# Patient Record
Sex: Male | Born: 1990 | Race: Black or African American | Hispanic: No | Marital: Single | Smoking: Former smoker
Health system: Southern US, Community
[De-identification: ages and names within clinical notes are randomized; demographics above are authoritative.]

---

## 2017-05-26 ENCOUNTER — Encounter (HOSPITAL_BASED_OUTPATIENT_CLINIC_OR_DEPARTMENT_OTHER): Payer: Self-pay

## 2017-05-26 ENCOUNTER — Emergency Department (HOSPITAL_BASED_OUTPATIENT_CLINIC_OR_DEPARTMENT_OTHER)
Admission: EM | Admit: 2017-05-26 | Discharge: 2017-05-26 | Disposition: A | Discharge status: Home / Self Care | Payer: BLUE CROSS/BLUE SHIELD | Attending: Emergency Medicine | Admitting: Emergency Medicine

## 2017-05-26 ENCOUNTER — Other Ambulatory Visit: Payer: Self-pay

## 2017-05-26 ENCOUNTER — Emergency Department (HOSPITAL_BASED_OUTPATIENT_CLINIC_OR_DEPARTMENT_OTHER): Payer: BLUE CROSS/BLUE SHIELD

## 2017-05-26 DIAGNOSIS — R05 Cough: Secondary | ICD-10-CM | POA: Diagnosis not present

## 2017-05-26 DIAGNOSIS — F17200 Nicotine dependence, unspecified, uncomplicated: Secondary | ICD-10-CM | POA: Insufficient documentation

## 2017-05-26 DIAGNOSIS — R059 Cough, unspecified: Secondary | ICD-10-CM

## 2017-05-26 MED ORDER — ALBUTEROL SULFATE HFA 108 (90 BASE) MCG/ACT IN AERS
2.0000 | INHALATION_SPRAY | Freq: Once | RESPIRATORY_TRACT | Status: AC
  Administered 2017-05-26: 2 via RESPIRATORY_TRACT
  Filled 2017-05-26: qty 6.7

## 2017-05-26 MED ORDER — OMEPRAZOLE-SODIUM BICARBONATE 40-1100 MG PO CAPS: 1.0000 | ORAL_CAPSULE | Freq: Every day | ORAL | 0 refills | Status: DC

## 2017-05-26 MED ORDER — LEVOCETIRIZINE DIHYDROCHLORIDE 5 MG PO TABS: 5.0000 mg | ORAL_TABLET | Freq: Every day | ORAL | 0 refills | Status: DC

## 2017-05-26 MED ORDER — AEROCHAMBER PLUS W/MASK MISC
1.0000 | Freq: Once | Status: AC
  Administered 2017-05-26: 1
  Filled 2017-05-26: qty 1

## 2017-05-26 NOTE — ED Triage Notes (Signed)
C/o prod cough "since the summer"-NAD-steady gait

## 2017-05-26 NOTE — ED Provider Notes (Signed)
[facility] EMERGENCY DEPARTMENT Provider Note   CSN: [id number] Arrival date & time: 05/26/17  1218     History   Chief Complaint Chief Complaint  Patient presents with  . Cough    HPI Lawrence Kelley is a 26 y.o. male  Cough: Patient complains of productive cough..  Symptoms began 6 months ago.  The cough is productive of clear sputum, waxing and waning over time and is aggravated by cold air Associated symptoms include occasional:wheezing. Patient does not have new pets. Patient does not have a history of asthma. Patient does not have a history of environmental allergens. Patient does not haverecent travel. Patient does have a history of smoking. Patient  does not have previous Chest X-ray. Patient acknowledges daily acid reflux sxs.    HPI  History reviewed. No pertinent past medical history.  There are no active problems to display for this patient.   History reviewed. No pertinent surgical history.     Home Medications    Prior to Admission medications   Medication Sig Start Date End Date Taking? Authorizing Provider  levocetirizine (XYZAL) 5 MG tablet Take 1 tablet (5 mg total) by mouth at bedtime. 05/26/17   Hernando Reali, Cammy CopaAbigail, PA-C  omeprazole-sodium bicarbonate (ZEGERID) 40-1100 MG capsule Take 1 capsule by mouth at bedtime. 05/26/17   Arthor CaptainHarris, Takila Kronberg, PA-C    Family History No family history on file.  Social History Social History   Tobacco Use  . Smoking status: Current Every Day Smoker  . Smokeless tobacco: Never Used  Substance Use Topics  . Alcohol use: Yes    Frequency: Never    Comment: occ  . Drug use: No     Allergies   Patient has no known allergies.   Review of Systems Review of Systems  Ten systems reviewed and are negative for acute change, except as noted in the HPI.   Physical Exam Updated Vital Signs BP 122/82 (BP Location: Left Arm)   Pulse 80   Temp 98.3 F (36.8 C) (Oral)   Resp 14   Ht 5\' 6"  (1.676 m)    Wt 83 kg (183 lb)   SpO2 96%   BMI 29.54 kg/m   Physical Exam  Constitutional: He appears well-developed and well-nourished. No distress.  HENT:  Head: Normocephalic and atraumatic.  Eyes: Conjunctivae are normal. No scleral icterus.  Neck: Normal range of motion. Neck supple.  Cardiovascular: Normal rate, regular rhythm and normal heart sounds.  Pulmonary/Chest: Effort normal and breath sounds normal. No respiratory distress.  Minimal expiratory wheeze  Abdominal: Soft. There is no tenderness.  Musculoskeletal: He exhibits no edema.  Neurological: He is alert.  Skin: Skin is warm and dry. He is not diaphoretic.  Psychiatric: His behavior is normal.  Nursing note and vitals reviewed.    ED Treatments / Results  Labs (all labs ordered are listed, but only abnormal results are displayed) Labs Reviewed - No data to display  EKG  EKG Interpretation None       Radiology Dg Chest 2 View  Result Date: 05/26/2017 CLINICAL DATA:  Cough since summer EXAM: CHEST  2 VIEW COMPARISON:  None. FINDINGS: Heart size and mediastinal contours are within normal limits. Study is slightly hypoinspiratory with crowding of the perihilar and bibasilar bronchovascular markings. Given the low lung volumes, lungs are clear. No pleural effusion or pneumothorax seen. No acute or suspicious osseous finding. IMPRESSION: No active cardiopulmonary disease. No evidence of pneumonia or pulmonary edema. Electronically Signed   By: Weyman CroonStan  Linde GillisMaynard M.D.   On: 05/26/2017 14:15    Procedures Procedures (including critical care time)  Medications Ordered in ED Medications  albuterol (PROVENTIL HFA;VENTOLIN HFA) 108 (90 Base) MCG/ACT inhaler 2 puff (not administered)  aerochamber plus with mask device 1 each (not administered)     Initial Impression / Assessment and Plan / ED Course  I have reviewed the triage vital signs and the nursing notes.  Pertinent labs & imaging results that were available during my  care of the patient were reviewed by me and considered in my medical decision making (see chart for details).     .patient with cough. Likely multifactorial.  The patient was counseled on the dangers of tobacco use, and was advised to quit.  Reviewed strategies to maximize success, including removing cigarettes and smoking materials from environment, stress management, substitution of other forms of reinforcement, support of family/friends and written materials. Will treat reflux sxs and add antihistamine. Patient will be discharged with albuterol. F/u with pcp   Final Clinical Impressions(s) / ED Diagnoses   Final diagnoses:  Cough  Smoking    ED Discharge Orders        Ordered    omeprazole-sodium bicarbonate (ZEGERID) 40-1100 MG capsule  Daily at bedtime     05/26/17 1454    levocetirizine (XYZAL) 5 MG tablet  Daily at bedtime     05/26/17 1454       Arthor CaptainHarris, Umaima Scholten, PA-C 05/26/17 1513    Raeford RazorKohut, Stephen, MD 05/27/17 [phone]

## 2017-05-26 NOTE — Discharge Instructions (Signed)
The Zegerid can be purchased as a generic at [facility]. You may use the inhaler 1-2 puffs every 2-4 hours as needed.   Contact a health care provider if: You have new symptoms. You cough up pus. Your cough does not get better after 2-3 weeks, or your cough gets worse. You cannot control your cough with suppressant medicines and you are losing sleep. You develop pain that is getting worse or pain that is not controlled with pain medicines. You have a fever. You have unexplained weight loss. You have night sweats. Get help right away if: You cough up blood. You have difficulty breathing. Your heartbeat is very fast.

## 2018-03-12 ENCOUNTER — Emergency Department (HOSPITAL_BASED_OUTPATIENT_CLINIC_OR_DEPARTMENT_OTHER): Payer: BLUE CROSS/BLUE SHIELD

## 2018-03-12 ENCOUNTER — Other Ambulatory Visit: Payer: Self-pay

## 2018-03-12 ENCOUNTER — Encounter (HOSPITAL_BASED_OUTPATIENT_CLINIC_OR_DEPARTMENT_OTHER): Payer: Self-pay

## 2018-03-12 ENCOUNTER — Emergency Department (HOSPITAL_BASED_OUTPATIENT_CLINIC_OR_DEPARTMENT_OTHER)
Admission: EM | Admit: 2018-03-12 | Discharge: 2018-03-13 | Disposition: A | Discharge status: Home / Self Care | Payer: BLUE CROSS/BLUE SHIELD | Attending: Emergency Medicine | Admitting: Emergency Medicine

## 2018-03-12 DIAGNOSIS — Z87891 Personal history of nicotine dependence: Secondary | ICD-10-CM | POA: Insufficient documentation

## 2018-03-12 DIAGNOSIS — Y9389 Activity, other specified: Secondary | ICD-10-CM | POA: Diagnosis not present

## 2018-03-12 DIAGNOSIS — Y92511 Restaurant or cafe as the place of occurrence of the external cause: Secondary | ICD-10-CM | POA: Insufficient documentation

## 2018-03-12 DIAGNOSIS — W230XXA Caught, crushed, jammed, or pinched between moving objects, initial encounter: Secondary | ICD-10-CM | POA: Insufficient documentation

## 2018-03-12 DIAGNOSIS — S61221A Laceration with foreign body of left index finger without damage to nail, initial encounter: Secondary | ICD-10-CM | POA: Insufficient documentation

## 2018-03-12 DIAGNOSIS — Y999 Unspecified external cause status: Secondary | ICD-10-CM | POA: Insufficient documentation

## 2018-03-12 DIAGNOSIS — S61211A Laceration without foreign body of left index finger without damage to nail, initial encounter: Secondary | ICD-10-CM

## 2018-03-12 NOTE — ED Triage Notes (Signed)
Pt caught his L index finger in a door. Pt has lac on posterior surface. Bandage applied in triage.

## 2018-03-13 MED ORDER — CEPHALEXIN 500 MG PO CAPS: 500.0000 mg | ORAL_CAPSULE | Freq: Three times a day (TID) | ORAL | 0 refills | Status: AC

## 2018-03-13 MED ORDER — BUPIVACAINE HCL 0.5 % IJ SOLN
5.0000 mL | Freq: Once | INTRAMUSCULAR | Status: AC
  Administered 2018-03-13: 5 mL
  Filled 2018-03-13: qty 1

## 2018-03-13 NOTE — ED Provider Notes (Signed)
[facility] EMERGENCY DEPARTMENT Provider Note   CSN: [id number] Arrival date & time: 03/12/18  2022     History   Chief Complaint Chief Complaint  Patient presents with  . Finger Injury    HPI Lawrence Kelley is a 27 y.o. male.  HPI 27 year old male here with left index finger injury.  The patient states that he was trying to open a door at a restaurant, when his index finger got stuck in the back end of the door.  He states that he immediately removed his finger.  He sustained a laceration.  He has associated aching, throbbing, but also sharp pain in his distal index finger.  He is right-handed.  He denies any history of easy bruising or bleeding.  He believes his tetanus is up-to-date.  No history of poor wound healing.  Pain is worse with palpation.  History reviewed. No pertinent past medical history.  There are no active problems to display for this patient.   History reviewed. No pertinent surgical history.      Home Medications    Prior to Admission medications   Medication Sig Start Date End Date Taking? Authorizing Provider  cephALEXin (KEFLEX) 500 MG capsule Take 1 capsule (500 mg total) by mouth 3 (three) times daily for 5 days. 03/13/18 03/18/18  Shaune Pollack, MD  levocetirizine (XYZAL) 5 MG tablet Take 1 tablet (5 mg total) by mouth at bedtime. 05/26/17   Harris, Cammy Copa, PA-C  omeprazole-sodium bicarbonate (ZEGERID) 40-1100 MG capsule Take 1 capsule by mouth at bedtime. 05/26/17   Arthor Captain, PA-C    Family History No family history on file.  Social History Social History   Tobacco Use  . Smoking status: Former Smoker    Last attempt to quit: 08/10/2017    Years since quitting: 0.5  . Smokeless tobacco: Never Used  Substance Use Topics  . Alcohol use: Yes    Frequency: Never    Comment: occ  . Drug use: No     Allergies   Patient has no known allergies.   Review of Systems Review of Systems  Constitutional: Negative for  chills and fever.  Respiratory: Negative for shortness of breath.   Cardiovascular: Negative for chest pain.  Musculoskeletal: Negative for neck pain.  Skin: Positive for wound. Negative for rash.  Allergic/Immunologic: Negative for immunocompromised state.  Neurological: Negative for weakness and numbness.  Hematological: Does not bruise/bleed easily.     Physical Exam Updated Vital Signs BP 139/84 (BP Location: Right Arm)   Pulse 66   Temp 98.4 F (36.9 C) (Oral)   Resp 18   Ht 5\' 6"  (1.676 m)   Wt 79.4 kg   SpO2 99%   BMI 28.25 kg/m   Physical Exam  Constitutional: He is oriented to person, place, and time. He appears well-developed and well-nourished. No distress.  HENT:  Head: Normocephalic and atraumatic.  Eyes: Conjunctivae are normal.  Neck: Neck supple.  Cardiovascular: Normal rate, regular rhythm and normal heart sounds.  Pulmonary/Chest: Effort normal. No respiratory distress. He has no wheezes.  Abdominal: He exhibits no distension.  Musculoskeletal: He exhibits no edema.  Neurological: He is alert and oriented to person, place, and time. He exhibits normal muscle tone.  Skin: Skin is warm. Capillary refill takes less than 2 seconds. No rash noted.  Nursing note and vitals reviewed.   UPPER EXTREMITY EXAM: Left  INSPECTION & PALPATION: Approximately 1 cm a avulsion/laceration along the dorsal aspect of the distal phalanx of the  index finger.  This does not appear to involve the underlying joint capsule or extensor tendon mechanism.  Moderate volume of dark red bleeding.  There is a superficial flap of the a avulsion that appears to extend over the nail bed, though the nail itself and germinal matrix appears intact  SENSORY: Sensation is intact to light touch in:  Superficial radial nerve distribution (dorsal first web space) Median nerve distribution (tip of index finger)   Ulnar nerve distribution (tip of small finger)     Sensation intact to ulnar and  radial aspects of distal index finger  MOTOR:  + Motor posterior interosseous nerve (thumb IP extension) + Anterior interosseous nerve (thumb IP flexion, index finger DIP flexion) + Radial nerve (wrist extension) + Median nerve (palpable firing thenar mass) + Ulnar nerve (palpable firing of first dorsal interosseous muscle)  VASCULAR: 2+ radial pulse Brisk capillary refill < 2 sec, fingers warm and well-perfused  TENDONS: Individual flexion at the DIP, PIP, and MCP intact for the index finger Extension full at the DIP, PIP, and MCP Normal abduction and abduction of the index finger    ED Treatments / Results  Labs (all labs ordered are listed, but only abnormal results are displayed) Labs Reviewed - No data to display  EKG None  Radiology Dg Finger Index Left  Result Date: 03/12/2018 CLINICAL DATA:  Slammed index finger in door today. Pain and swelling. EXAM: LEFT INDEX FINGER 2+V COMPARISON:  None. FINDINGS: There is no evidence of fracture or dislocation. There is no evidence of arthropathy or other focal bone abnormality. Soft tissue swelling is seen along the DIP joint. IMPRESSION: No evidence of fracture or dislocation. Electronically Signed   By: Myles Rosenthal M.D.   On: 03/12/2018 22:21    Procedures .Marland KitchenLaceration Repair Date/Time: 03/13/2018 3:44 AM Performed by: Shaune Pollack, MD Authorized by: Shaune Pollack, MD   Consent:    Consent obtained:  Verbal   Consent given by:  Patient   Risks discussed:  Infection, need for additional repair, pain, tendon damage, retained foreign body, vascular damage, poor cosmetic result, poor wound healing and nerve damage   Alternatives discussed:  Referral and delayed treatment Anesthesia (see MAR for exact dosages):    Anesthesia method:  Nerve block   Block location:  Digital   Block needle gauge:  27 G   Block anesthetic:  Bupivacaine 0.25% w/o epi   Block technique:  Digital block, medial and lateral injections    Block injection procedure:  Anatomic landmarks identified, anatomic landmarks palpated, introduced needle and negative aspiration for blood   Block outcome:  Anesthesia achieved Laceration details:    Location: Left doral index finger.   Length (cm):  1 Repair type:    Repair type:  Simple Pre-procedure details:    Preparation:  Patient was prepped and draped in usual sterile fashion and imaging obtained to evaluate for foreign bodies Exploration:    Hemostasis achieved with:  Direct pressure   Wound exploration: wound explored through full range of motion and entire depth of wound probed and visualized     Contaminated: no   Treatment:    Area cleansed with:  Betadine   Amount of cleaning:  Extensive   Irrigation solution:  Sterile water   Irrigation method:  Pressure wash Skin repair:    Repair method:  Sutures   Suture size:  4-0   Suture material:  Prolene   Suture technique:  Simple interrupted   Number of sutures:  3  Approximation:    Approximation:  Close Post-procedure details:    Dressing:  Antibiotic ointment   Patient tolerance of procedure:  Tolerated well, no immediate complications   (including critical care time)  Medications Ordered in ED Medications  bupivacaine (MARCAINE) 0.5 % (with pres) injection 5 mL (5 mLs Infiltration Given 03/13/18 0034)     Initial Impression / Assessment and Plan / ED Course  I have reviewed the triage vital signs and the nursing notes.  Pertinent labs & imaging results that were available during my care of the patient were reviewed by me and considered in my medical decision making (see chart for details).     27 year old right-hand-dominant male here with laceration of the dorsal aspect of the left index distal phalanx.  Plain films negative.  The wound was thoroughly cleaned and repaired as above.  Patient tolerated well.  There does appear to be extension of a superficial avulsion flap to the nail matrix, though the germinal  matrix appears intact with no apparent nail injury.  Discussed that patient may have long-term change to his nail bed and the indications for follow-up with a hand surgeon.  He does not have one and would like to follow-up with a PCP at this time.  Additionally, there is apparent no involvement of the extensor mechanism, but we discussed watching out for deformities of the DIP or evidence to suggest an occult tendon injury which would necessitate follow-up with a hand surgeon.  Final Clinical Impressions(s) / ED Diagnoses   Final diagnoses:  Laceration of left index finger without foreign body without damage to nail, initial encounter    ED Discharge Orders         Ordered    cephALEXin (KEFLEX) 500 MG capsule  3 times daily     03/13/18 0116           Shaune Pollack, MD 03/13/18 [phone]

## 2018-03-13 NOTE — Discharge Instructions (Addendum)
Wear your splint as often as possible.  You can shower but do not rub the wound.  Do not swim.  Take the antibiotics as prescribed.  Take tylenol and advil as needed for pain  Follow-up in 7-10 days for suture removal

## 2018-03-13 NOTE — ED Notes (Signed)
Patient verbalizes understanding of discharge instructions. Opportunity for questioning and answers were provided. Armband removed by staff, pt discharged from ED to his home via POV.  

## 2018-03-13 NOTE — ED Notes (Signed)
Pt slammed his finger in a car door. Finger has a laceration but no deformity.

## 2018-03-25 ENCOUNTER — Encounter (HOSPITAL_BASED_OUTPATIENT_CLINIC_OR_DEPARTMENT_OTHER): Payer: Self-pay

## 2018-03-25 ENCOUNTER — Other Ambulatory Visit: Payer: Self-pay

## 2018-03-25 ENCOUNTER — Emergency Department (HOSPITAL_BASED_OUTPATIENT_CLINIC_OR_DEPARTMENT_OTHER)
Admission: EM | Admit: 2018-03-25 | Discharge: 2018-03-25 | Disposition: A | Discharge status: Home / Self Care | Payer: BLUE CROSS/BLUE SHIELD | Attending: Emergency Medicine | Admitting: Emergency Medicine

## 2018-03-25 DIAGNOSIS — Z79899 Other long term (current) drug therapy: Secondary | ICD-10-CM | POA: Diagnosis not present

## 2018-03-25 DIAGNOSIS — Z87891 Personal history of nicotine dependence: Secondary | ICD-10-CM | POA: Insufficient documentation

## 2018-03-25 DIAGNOSIS — Z4802 Encounter for removal of sutures: Secondary | ICD-10-CM | POA: Diagnosis present

## 2018-03-25 NOTE — ED Provider Notes (Signed)
[facility] EMERGENCY DEPARTMENT Provider Note   CSN: [id number] Arrival date & time: 03/25/18  1507     History   Chief Complaint Chief Complaint  Patient presents with  . Suture / Staple Removal    HPI Lawrence Kelley is a 27 y.o. male.  27 year old male presents for suture removal from left index finger.  Sutures placed 2 weeks ago, denies complaints or concerns.     History reviewed. No pertinent past medical history.  There are no active problems to display for this patient.   History reviewed. No pertinent surgical history.      Home Medications    Prior to Admission medications   Medication Sig Start Date End Date Taking? Authorizing Provider  levocetirizine (XYZAL) 5 MG tablet Take 1 tablet (5 mg total) by mouth at bedtime. 05/26/17   Harris, Cammy Copa, PA-C  omeprazole-sodium bicarbonate (ZEGERID) 40-1100 MG capsule Take 1 capsule by mouth at bedtime. 05/26/17   Arthor Captain, PA-C    Family History No family history on file.  Social History Social History   Tobacco Use  . Smoking status: Former Smoker    Last attempt to quit: 08/10/2017    Years since quitting: 0.6  . Smokeless tobacco: Never Used  Substance Use Topics  . Alcohol use: Yes    Frequency: Never    Comment: occ  . Drug use: No     Allergies   Patient has no known allergies.   Review of Systems Review of Systems  Constitutional: Negative for fever.  Skin: Positive for wound.  Allergic/Immunologic: Negative for immunocompromised state.  Neurological: Negative for weakness and numbness.  All other systems reviewed and are negative.    Physical Exam Updated Vital Signs BP 125/80 (BP Location: Left Arm)   Pulse 74   Temp 98.6 F (37 C) (Oral)   Resp 18   SpO2 99%   Physical Exam  Constitutional: He is oriented to person, place, and time. He appears well-developed and well-nourished. No distress.  HENT:  Head: Normocephalic and atraumatic.    Cardiovascular: Intact distal pulses.  Pulmonary/Chest: Effort normal.  Musculoskeletal: He exhibits no tenderness or deformity.       Hands: Neurological: He is alert and oriented to person, place, and time. No sensory deficit.  Skin: Skin is warm and dry. He is not diaphoretic. No erythema.  Psychiatric: He has a normal mood and affect. His behavior is normal.  Nursing note and vitals reviewed.    ED Treatments / Results  Labs (all labs ordered are listed, but only abnormal results are displayed) Labs Reviewed - No data to display  EKG None  Radiology No results found.  Procedures .Suture Removal Date/Time: 03/25/2018 3:43 PM Performed by: Jeannie Fend, PA-C Authorized by: Jeannie Fend, PA-C   Consent:    Consent obtained:  Verbal   Consent given by:  Patient   Risks discussed:  Bleeding, pain and wound separation   Alternatives discussed:  No treatment Location:    Location:  Upper extremity   Upper extremity location:  Hand   Hand location:  L index finger Procedure details:    Wound appearance:  No signs of infection   Number of sutures removed:  3 Post-procedure details:    Post-removal:  No dressing applied   Patient tolerance of procedure:  Tolerated well, no immediate complications   (including critical care time)  Medications Ordered in ED Medications - No data to display   Initial Impression / Assessment  and Plan / ED Course  I have reviewed the triage vital signs and the nursing notes.  Pertinent labs & imaging results that were available during my care of the patient were reviewed by me and considered in my medical decision making (see chart for details).    Final Clinical Impressions(s) / ED Diagnoses   Final diagnoses:  Visit for suture removal    ED Discharge Orders    None       Jeannie Fend, PA-C 03/25/18 1544    Arby Barrette, MD 04/02/18 1455

## 2018-03-25 NOTE — ED Triage Notes (Signed)
For suture removal to left index finger-placed 10/12-NAD-steady gait

## 2019-12-07 IMAGING — CR DG FINGER INDEX 2+V*L*
3 series · 3 of 3 positions shown · non-contrast
Comparison: None.

CLINICAL DATA: Slammed index finger in door today. Pain and
swelling.

EXAM:
LEFT INDEX FINGER 2+V

[x finger pa left]
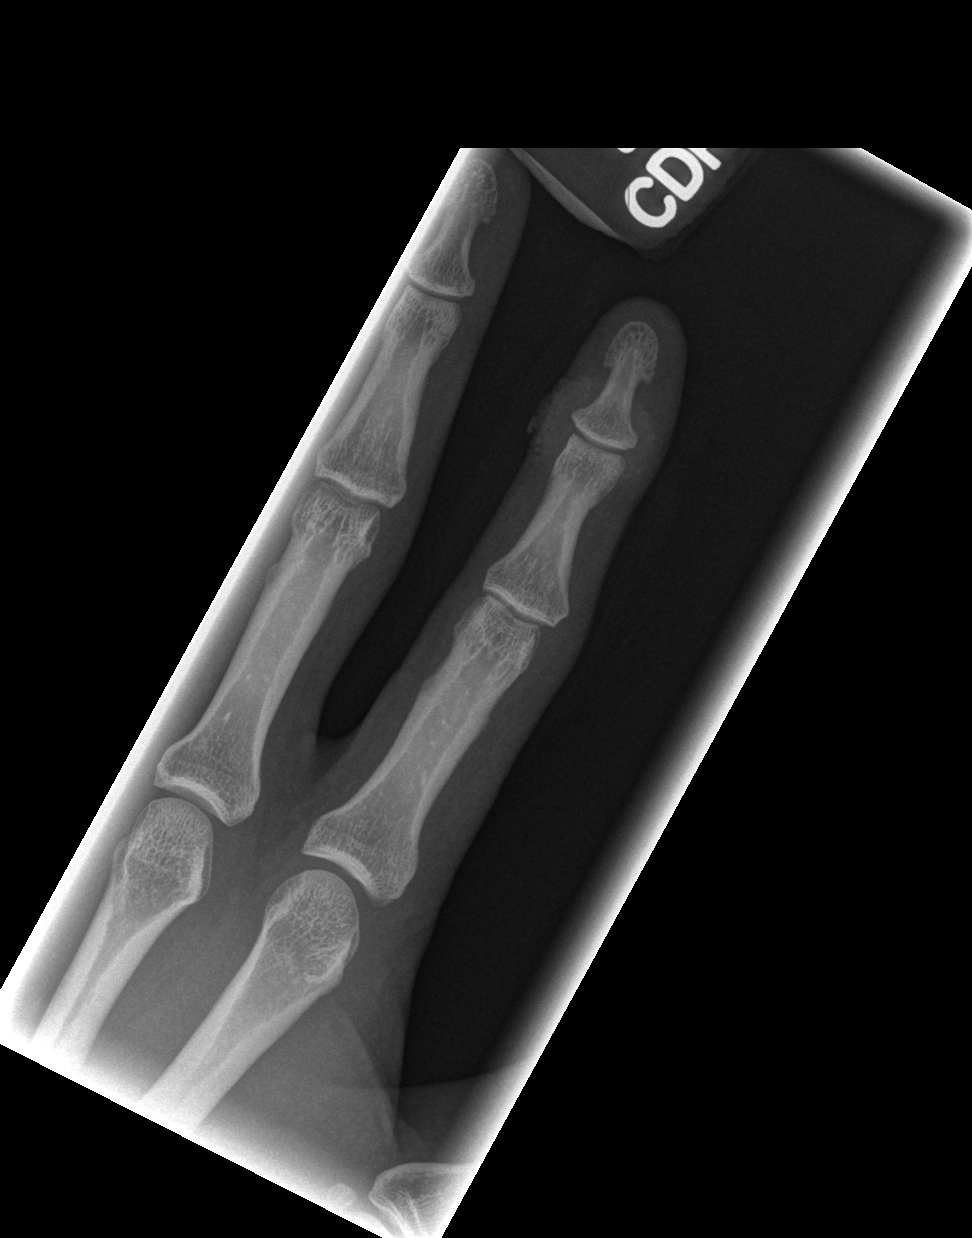

[x finger obl. left]
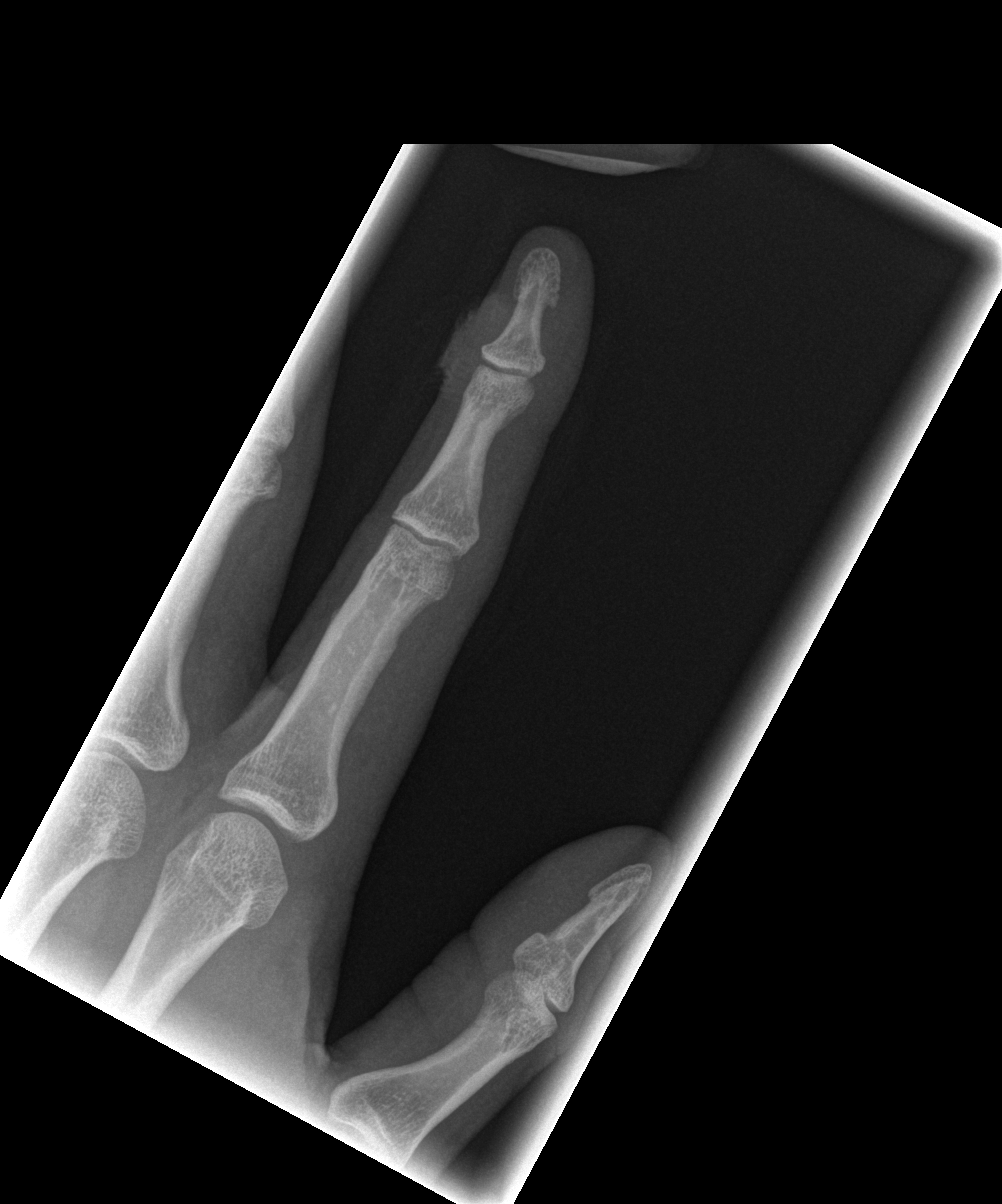

[x finger lateral left]
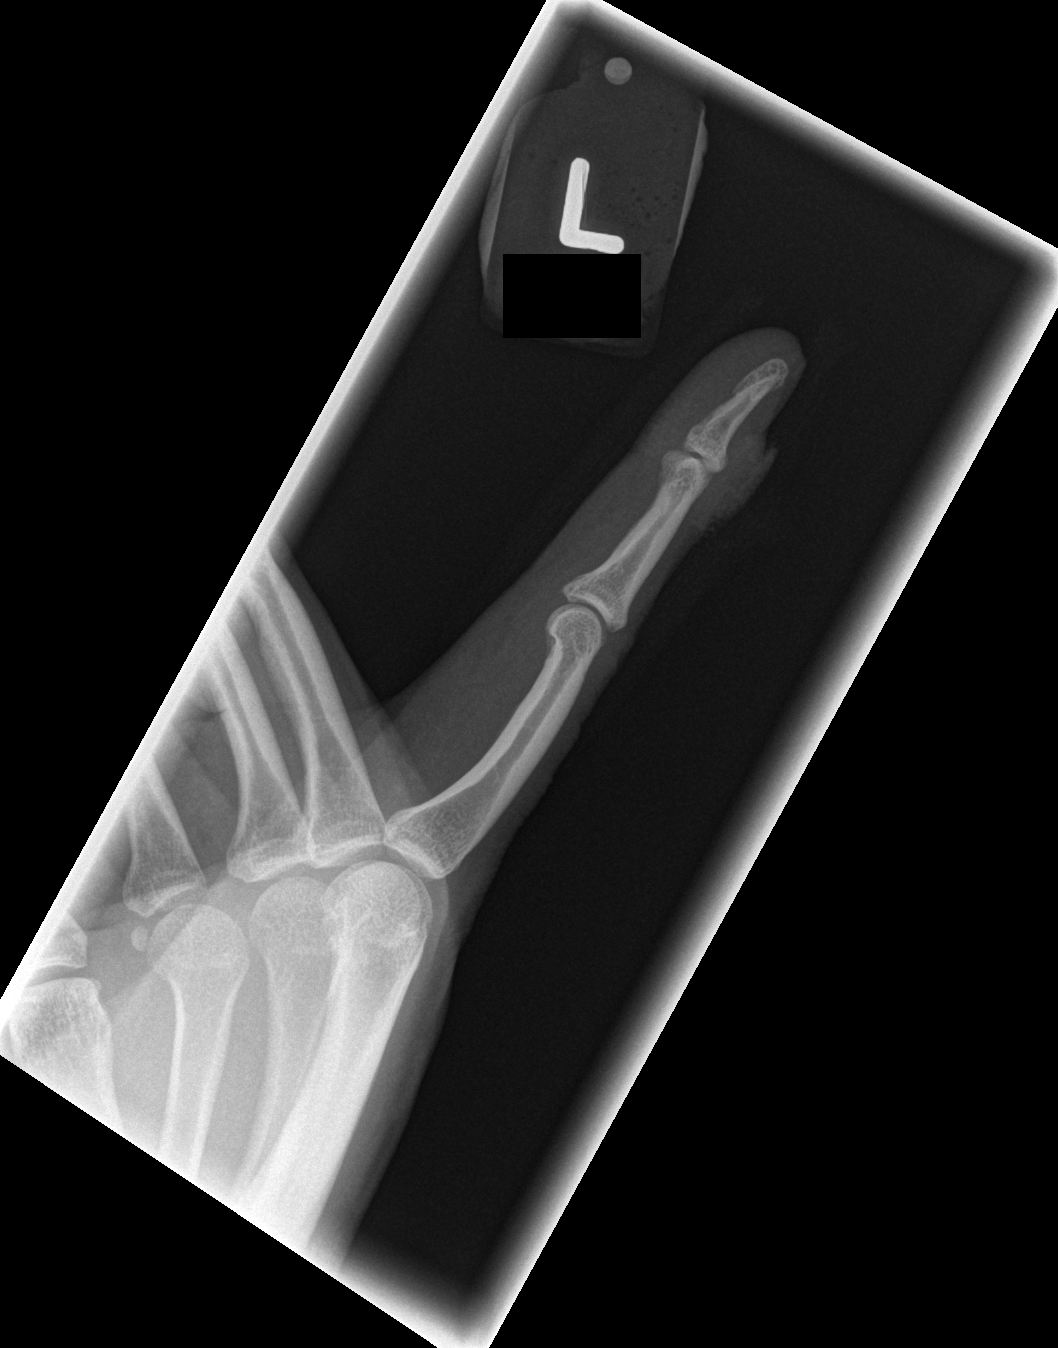

[3 of 3 positions shown; findings below may reference images not displayed]

FINDINGS: There is no evidence of fracture or dislocation. There is no
evidence of arthropathy or other focal bone abnormality. Soft tissue
swelling is seen along the DIP joint.
IMPRESSION: No evidence of fracture or dislocation.

## 2020-06-11 ENCOUNTER — Other Ambulatory Visit: Payer: Self-pay

## 2020-06-11 ENCOUNTER — Emergency Department (HOSPITAL_BASED_OUTPATIENT_CLINIC_OR_DEPARTMENT_OTHER)
Admission: EM | Admit: 2020-06-11 | Discharge: 2020-06-11 | Disposition: A | Discharge status: Home / Self Care | Payer: BC Managed Care – PPO | Attending: Emergency Medicine | Admitting: Emergency Medicine

## 2020-06-11 ENCOUNTER — Encounter (HOSPITAL_BASED_OUTPATIENT_CLINIC_OR_DEPARTMENT_OTHER): Payer: Self-pay | Admitting: Emergency Medicine

## 2020-06-11 DIAGNOSIS — Z87891 Personal history of nicotine dependence: Secondary | ICD-10-CM | POA: Insufficient documentation

## 2020-06-11 DIAGNOSIS — J069 Acute upper respiratory infection, unspecified: Secondary | ICD-10-CM | POA: Insufficient documentation

## 2020-06-11 DIAGNOSIS — Z20822 Contact with and (suspected) exposure to covid-19: Secondary | ICD-10-CM

## 2020-06-11 DIAGNOSIS — U071 COVID-19: Secondary | ICD-10-CM | POA: Diagnosis not present

## 2020-06-11 DIAGNOSIS — R059 Cough, unspecified: Secondary | ICD-10-CM | POA: Diagnosis present

## 2020-06-11 LAB — SARS CORONAVIRUS 2 (TAT 6-24 HRS): SARS Coronavirus 2: POSITIVE — AB

## 2020-06-11 NOTE — ED Provider Notes (Signed)
[facility] EMERGENCY DEPARTMENT Provider Note  CSN: [id number] Arrival date & time: 06/11/20 6578    History Chief Complaint  Patient presents with  . Cough    HPI  Lawrence Kelley is a 30 y.o. male with no significant PMH reports he has had a cough for about 2 years but since yesterday has been coughing up sputum, feeling some mild SOB and was running a high fever at home. His nurse at work recommended he get tested for Covid. He has been vaccinated.    History reviewed. No pertinent past medical history.  History reviewed. No pertinent surgical history.  No family history on file.  Social History   Tobacco Use  . Smoking status: Former Smoker    Quit date: 08/10/2017    Years since quitting: 2.8  . Smokeless tobacco: Never Used  Vaping Use  . Vaping Use: Never used  Substance Use Topics  . Alcohol use: Yes    Comment: occ  . Drug use: No     Home Medications Prior to Admission medications   Medication Sig Start Date End Date Taking? Authorizing Provider  levocetirizine (XYZAL) 5 MG tablet Take 1 tablet (5 mg total) by mouth at bedtime. 05/26/17 06/11/20  Arthor Captain, PA-C  omeprazole-sodium bicarbonate (ZEGERID) 40-1100 MG capsule Take 1 capsule by mouth at bedtime. 05/26/17 06/11/20  Arthor Captain, PA-C     Allergies    Patient has no known allergies.   Review of Systems   Review of Systems A comprehensive review of systems was completed and negative except as noted in HPI.    Physical Exam BP 128/90 (BP Location: Right Arm)   Pulse 87   Temp 98.6 F (37 C) (Oral)   Resp 18   Ht 5\' 7"  (1.702 m)   Wt 83 kg   SpO2 98%   BMI 28.66 kg/m   Physical Exam Vitals and nursing note reviewed.  Constitutional:      Appearance: Normal appearance.  HENT:     Head: Normocephalic and atraumatic.     Nose: Nose normal.     Mouth/Throat:     Mouth: Mucous membranes are moist.  Eyes:     Extraocular Movements: Extraocular movements intact.      Conjunctiva/sclera: Conjunctivae normal.  Cardiovascular:     Rate and Rhythm: Normal rate.  Pulmonary:     Effort: Pulmonary effort is normal.     Breath sounds: Normal breath sounds.  Abdominal:     General: Abdomen is flat.     Palpations: Abdomen is soft.     Tenderness: There is no abdominal tenderness.  Musculoskeletal:        General: No swelling. Normal range of motion.     Cervical back: Neck supple.  Skin:    General: Skin is warm and dry.  Neurological:     General: No focal deficit present.     Mental Status: He is alert.  Psychiatric:        Mood and Affect: Mood normal.      ED Results / Procedures / Treatments   Labs (all labs ordered are listed, but only abnormal results are displayed) Labs Reviewed  SARS CORONAVIRUS 2 (TAT 6-24 HRS)    EKG None   Radiology No results found.  Procedures Procedures  Medications Ordered in the ED Medications - No data to display   MDM Rules/Calculators/A&P MDM Patient with mild URI symptoms. Could be Covid. Exam is benign. Will check a swab here. Patient  given standard quarantine instructions pending result. PCP follow up. RTED for any worsening including signs of hypoxia.   ED Course  I have reviewed the triage vital signs and the nursing notes.  Pertinent labs & imaging results that were available during my care of the patient were reviewed by me and considered in my medical decision making (see chart for details).     Final Clinical Impression(s) / ED Diagnoses Final diagnoses:  Viral URI with cough  Person under investigation for COVID-19    Rx / DC Orders ED Discharge Orders    None       Pollyann Savoy, MD 06/11/20 [phone]

## 2020-06-11 NOTE — ED Triage Notes (Signed)
Cough with mild SOB since yesterday.
